# Patient Record
Sex: Female | Born: 1935 | Race: White | Hispanic: No
Health system: Southern US, Community
[De-identification: ages and names within clinical notes are randomized; demographics above are authoritative.]

---

## 2005-06-30 ENCOUNTER — Emergency Department: Payer: Self-pay | Admitting: Emergency Medicine

## 2005-06-30 ENCOUNTER — Other Ambulatory Visit: Payer: Self-pay

## 2005-12-10 ENCOUNTER — Other Ambulatory Visit: Payer: Self-pay

## 2005-12-10 ENCOUNTER — Inpatient Hospital Stay: Payer: Self-pay | Admitting: Internal Medicine

## 2006-12-21 ENCOUNTER — Other Ambulatory Visit: Payer: Self-pay

## 2006-12-21 ENCOUNTER — Inpatient Hospital Stay: Payer: Self-pay | Admitting: Internal Medicine

## 2012-08-02 ENCOUNTER — Ambulatory Visit: Payer: Self-pay | Admitting: Podiatry

## 2012-08-02 DIAGNOSIS — I10 Essential (primary) hypertension: Secondary | ICD-10-CM

## 2012-08-02 LAB — BASIC METABOLIC PANEL
Anion Gap: 7 (ref 7–16)
Calcium, Total: 9.2 mg/dL (ref 8.5–10.1)
Chloride: 101 mmol/L (ref 98–107)
Co2: 31 mmol/L (ref 21–32)
Creatinine: 0.77 mg/dL (ref 0.60–1.30)
EGFR (African American): >60
EGFR (Non-African Amer.): >60
Glucose: 106 mg/dL — ABNORMAL HIGH (ref 65–99)
Osmolality: 278 (ref 275–301)

## 2012-08-02 LAB — CBC WITH DIFFERENTIAL/PLATELET
Basophil %: 0.6 %
Eosinophil #: 0.4 10*3/uL (ref 0.0–0.7)
Eosinophil %: 4.2 %
Lymphocyte #: 2.2 10*3/uL (ref 1.0–3.6)
MCH: 30.2 pg (ref 26.0–34.0)
MCHC: 33.5 g/dL (ref 32.0–36.0)
Monocyte #: 0.9 x10 3/mm (ref 0.2–0.9)
Neutrophil #: 6.2 10*3/uL (ref 1.4–6.5)
Neutrophil %: 63.5 %
Platelet: 290 10*3/uL (ref 150–440)
RBC: 4.19 10*6/uL (ref 3.80–5.20)
RDW: 14 % (ref 11.5–14.5)
WBC: 9.7 10*3/uL (ref 3.6–11.0)

## 2012-08-08 ENCOUNTER — Ambulatory Visit: Payer: Self-pay | Admitting: Vascular Surgery

## 2012-08-08 LAB — CREATININE, SERUM
Creatinine: 0.94 mg/dL (ref 0.60–1.30)
EGFR (Non-African Amer.): 55 — ABNORMAL LOW

## 2012-08-12 ENCOUNTER — Ambulatory Visit: Payer: Self-pay | Admitting: Podiatry

## 2012-08-15 LAB — PATHOLOGY REPORT

## 2012-08-23 ENCOUNTER — Ambulatory Visit: Payer: Self-pay | Admitting: Internal Medicine

## 2012-09-01 ENCOUNTER — Ambulatory Visit: Payer: Self-pay | Admitting: Vascular Surgery

## 2012-09-01 LAB — CREATININE, SERUM
EGFR (African American): >60
EGFR (Non-African Amer.): >60

## 2012-09-02 LAB — CBC WITH DIFFERENTIAL/PLATELET
Basophil %: 0.4 %
Eosinophil %: 2.6 %
HCT: 32.9 % — ABNORMAL LOW (ref 35.0–47.0)
HGB: 11.5 g/dL — ABNORMAL LOW (ref 12.0–16.0)
Lymphocyte #: 2 10*3/uL (ref 1.0–3.6)
MCH: 31.1 pg (ref 26.0–34.0)
MCV: 89 fL (ref 80–100)
Monocyte #: 0.9 x10 3/mm (ref 0.2–0.9)
Monocyte %: 7.9 %
Neutrophil %: 70.3 %
RBC: 3.69 10*6/uL — ABNORMAL LOW (ref 3.80–5.20)

## 2012-09-02 LAB — BASIC METABOLIC PANEL
Anion Gap: 9 (ref 7–16)
Chloride: 104 mmol/L (ref 98–107)
Creatinine: 0.63 mg/dL (ref 0.60–1.30)
EGFR (Non-African Amer.): >60
Glucose: 90 mg/dL (ref 65–99)
Osmolality: 277 (ref 275–301)
Sodium: 139 mmol/L (ref 136–145)

## 2012-09-06 ENCOUNTER — Inpatient Hospital Stay: Payer: Self-pay | Admitting: Internal Medicine

## 2012-09-06 LAB — URINALYSIS, COMPLETE
Bacteria: NONE SEEN
Bilirubin,UR: NEGATIVE
Blood: NEGATIVE
Ketone: NEGATIVE
Ph: 6 (ref 4.5–8.0)
Specific Gravity: 1.013 (ref 1.003–1.030)
Squamous Epithelial: 1
WBC UR: 2 /HPF (ref 0–5)

## 2012-09-06 LAB — COMPREHENSIVE METABOLIC PANEL
BUN: 10 mg/dL (ref 7–18)
Chloride: 102 mmol/L (ref 98–107)
Creatinine: 0.75 mg/dL (ref 0.60–1.30)
EGFR (African American): >60
Glucose: 107 mg/dL — ABNORMAL HIGH (ref 65–99)
SGOT(AST): 23 U/L (ref 15–37)
SGPT (ALT): 16 U/L (ref 12–78)
Sodium: 140 mmol/L (ref 136–145)
Total Protein: 7.7 g/dL (ref 6.4–8.2)

## 2012-09-06 LAB — CBC
HCT: 31.8 % — ABNORMAL LOW (ref 35.0–47.0)
HGB: 10.7 g/dL — ABNORMAL LOW (ref 12.0–16.0)
MCH: 29.5 pg (ref 26.0–34.0)
MCHC: 33.6 g/dL (ref 32.0–36.0)
MCV: 88 fL (ref 80–100)
RBC: 3.62 10*6/uL — ABNORMAL LOW (ref 3.80–5.20)
RDW: 13.7 % (ref 11.5–14.5)

## 2012-09-07 LAB — URINE CULTURE

## 2012-09-07 LAB — CBC WITH DIFFERENTIAL/PLATELET
Basophil #: 0 10*3/uL (ref 0.0–0.1)
Basophil %: 0.6 %
Eosinophil #: 0.3 10*3/uL (ref 0.0–0.7)
HCT: 32 % — ABNORMAL LOW (ref 35.0–47.0)
HGB: 11 g/dL — ABNORMAL LOW (ref 12.0–16.0)
Lymphocyte #: 1.4 10*3/uL (ref 1.0–3.6)
Lymphocyte %: 17.9 %
MCHC: 34.3 g/dL (ref 32.0–36.0)
Monocyte #: 0.8 x10 3/mm (ref 0.2–0.9)
Monocyte %: 9.6 %
Neutrophil #: 5.5 10*3/uL (ref 1.4–6.5)
Neutrophil %: 68.1 %
Platelet: 289 10*3/uL (ref 150–440)
WBC: 8.1 10*3/uL (ref 3.6–11.0)

## 2012-09-07 LAB — COMPREHENSIVE METABOLIC PANEL
Albumin: 2.7 g/dL — ABNORMAL LOW (ref 3.4–5.0)
Alkaline Phosphatase: 84 U/L (ref 50–136)
Anion Gap: 7 (ref 7–16)
BUN: 9 mg/dL (ref 7–18)
Calcium, Total: 8.6 mg/dL (ref 8.5–10.1)
EGFR (Non-African Amer.): >60
Glucose: 99 mg/dL (ref 65–99)
Potassium: 3.8 mmol/L (ref 3.5–5.1)
SGOT(AST): 18 U/L (ref 15–37)
Sodium: 139 mmol/L (ref 136–145)
Total Protein: 7.3 g/dL (ref 6.4–8.2)

## 2012-09-11 LAB — PLATELET COUNT: Platelet: 283 10*3/uL (ref 150–440)

## 2012-09-13 LAB — CBC WITH DIFFERENTIAL/PLATELET
Basophil #: 0.1 10*3/uL (ref 0.0–0.1)
Basophil %: 0.5 %
Eosinophil %: 0.4 %
HCT: 30.1 % — ABNORMAL LOW (ref 35.0–47.0)
HGB: 9.9 g/dL — ABNORMAL LOW (ref 12.0–16.0)
Lymphocyte #: 1.3 10*3/uL (ref 1.0–3.6)
Lymphocyte %: 10.6 %
MCH: 29 pg (ref 26.0–34.0)
Monocyte #: 0.8 x10 3/mm (ref 0.2–0.9)
Monocyte %: 7 %
Neutrophil #: 9.7 10*3/uL — ABNORMAL HIGH (ref 1.4–6.5)
Neutrophil %: 81.5 %
Platelet: 320 10*3/uL (ref 150–440)
RBC: 3.42 10*6/uL — ABNORMAL LOW (ref 3.80–5.20)
RDW: 13.5 % (ref 11.5–14.5)
WBC: 11.9 10*3/uL — ABNORMAL HIGH (ref 3.6–11.0)

## 2012-09-13 LAB — CREATININE, SERUM: EGFR (Non-African Amer.): >60

## 2012-09-23 ENCOUNTER — Ambulatory Visit: Payer: Self-pay | Admitting: Internal Medicine

## 2012-10-10 ENCOUNTER — Ambulatory Visit: Payer: Self-pay | Admitting: Vascular Surgery

## 2012-10-13 ENCOUNTER — Ambulatory Visit: Payer: Self-pay | Admitting: Vascular Surgery

## 2012-10-23 ENCOUNTER — Ambulatory Visit: Payer: Self-pay | Admitting: Internal Medicine

## 2012-11-10 ENCOUNTER — Ambulatory Visit: Payer: Self-pay | Admitting: Vascular Surgery

## 2012-12-15 ENCOUNTER — Ambulatory Visit: Payer: Self-pay | Admitting: Vascular Surgery

## 2012-12-15 LAB — BASIC METABOLIC PANEL
Anion Gap: 6 — ABNORMAL LOW (ref 7–16)
BUN: 11 mg/dL (ref 7–18)
Calcium, Total: 8.9 mg/dL (ref 8.5–10.1)
Co2: 30 mmol/L (ref 21–32)
Creatinine: 0.68 mg/dL (ref 0.60–1.30)
EGFR (African American): >60
EGFR (Non-African Amer.): >60
Potassium: 4.4 mmol/L (ref 3.5–5.1)

## 2012-12-15 LAB — CBC
HCT: 34.8 % — ABNORMAL LOW (ref 35.0–47.0)
HGB: 11.3 g/dL — ABNORMAL LOW (ref 12.0–16.0)
MCHC: 32.3 g/dL (ref 32.0–36.0)
Platelet: 373 10*3/uL (ref 150–440)
RBC: 4.18 10*6/uL (ref 3.80–5.20)
RDW: 15.1 % — ABNORMAL HIGH (ref 11.5–14.5)
WBC: 9.3 10*3/uL (ref 3.6–11.0)

## 2012-12-22 ENCOUNTER — Ambulatory Visit: Payer: Self-pay | Admitting: Vascular Surgery

## 2012-12-24 ENCOUNTER — Ambulatory Visit: Payer: Self-pay | Admitting: Nurse Practitioner

## 2013-01-21 ENCOUNTER — Ambulatory Visit: Payer: Self-pay | Admitting: Nurse Practitioner

## 2013-04-16 IMAGING — XA IR VASCULAR PROCEDURE
15 of 18 series · 15 of 24 positions shown · IV contrast (IODINE)
Comparison: none

[Series 1: care aorta · 1 of 2 slices shown]
[im 1/2]
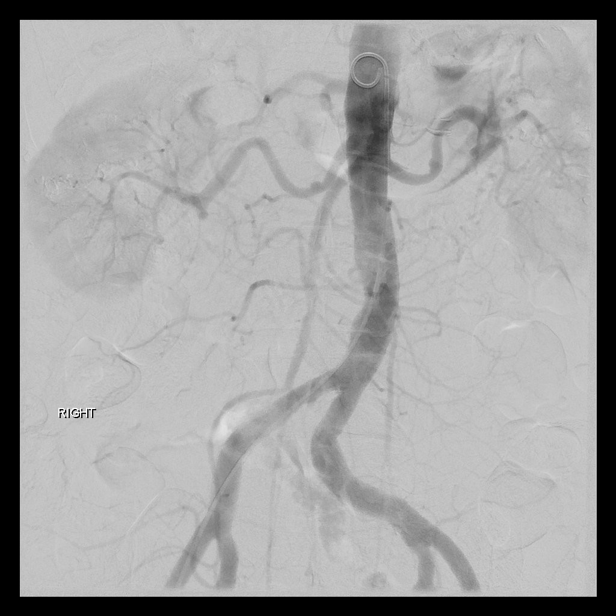

[Series 3: sfa · 1 of 1 slices shown (1 of 12)]
[im 1/1]
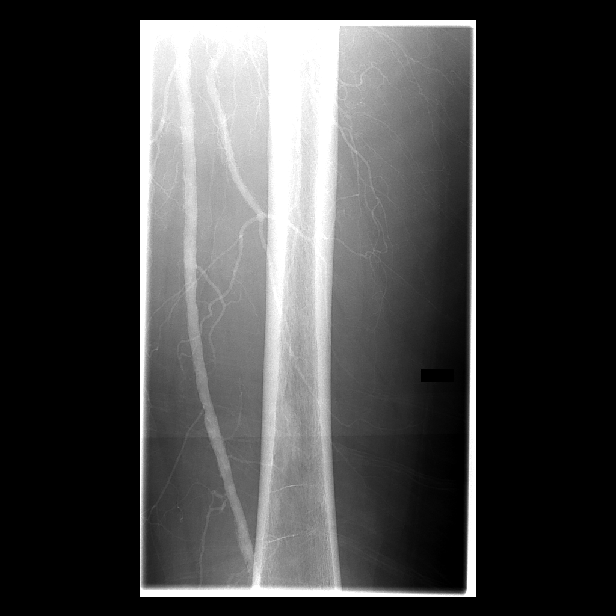

[Series 4: sfa · 1 of 2 slices shown (2 of 12)]
[im 2/2]
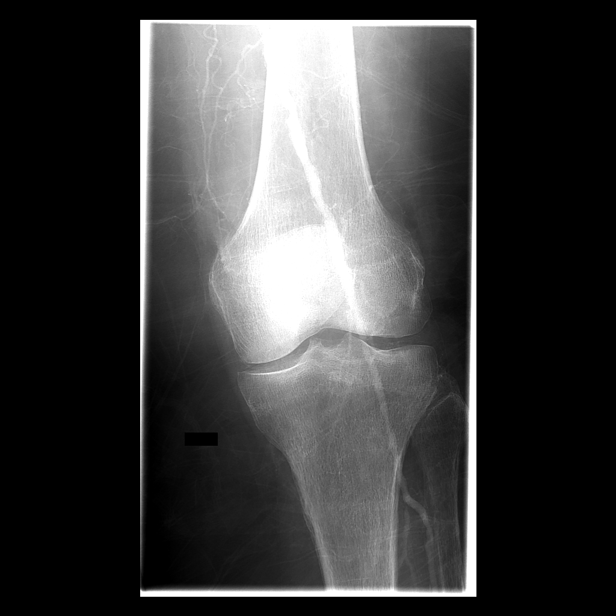

[Series 5: sfa · 1 of 1 slices shown (3 of 12)]
[im 1/1]
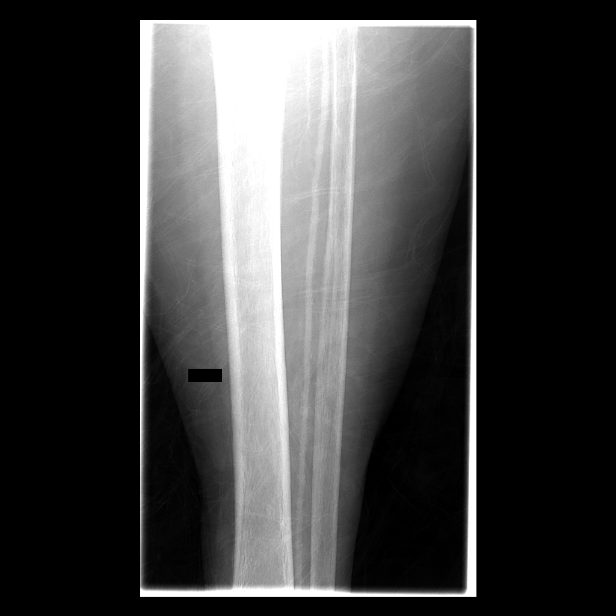

[Series 6: sfa · 1 of 2 slices shown (4 of 12)]
[im 2/2]
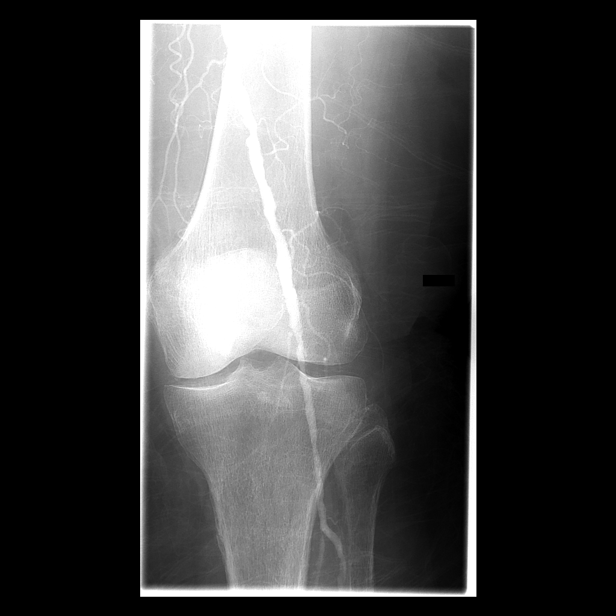

[Series 7: sfa · 1 of 2 slices shown (5 of 12)]
[im 1/2]
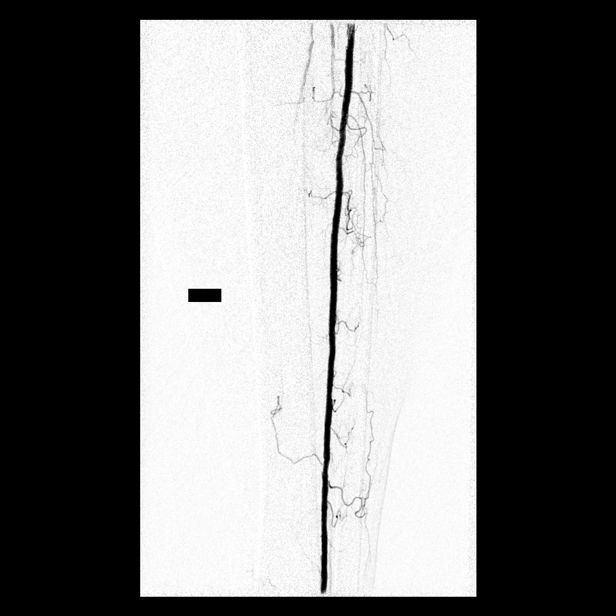

[Series 8: sfa · 1 of 1 slices shown (6 of 12)]
[im 1/1]
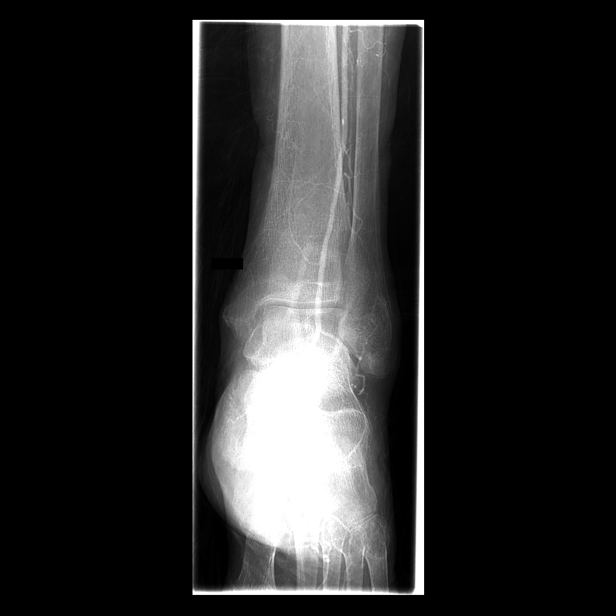

[Series 10: fl - angio · 1 of 1 slices shown (1 of 2)]
[im 1/1]
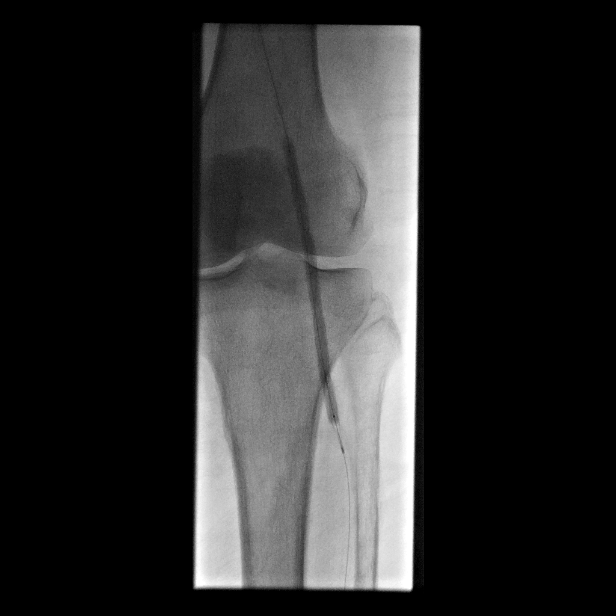

[Series 11: sfa · 1 of 2 slices shown (7 of 12)]
[im 1/2]
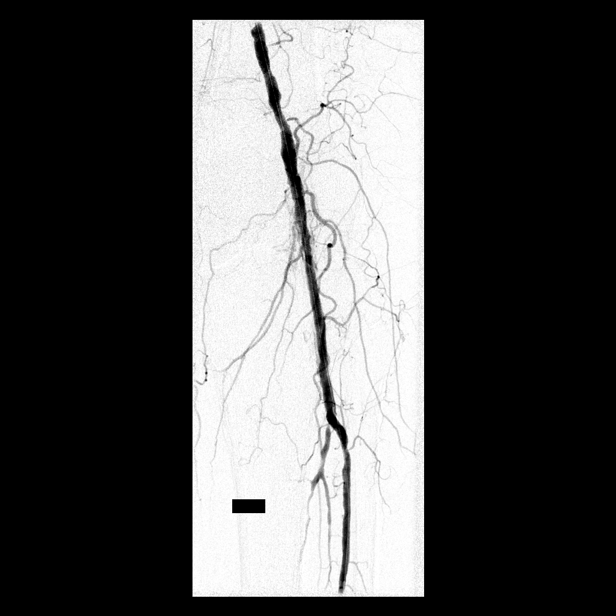

[Series 12: sfa · 1 of 1 slices shown (8 of 12)]
[im 1/1]
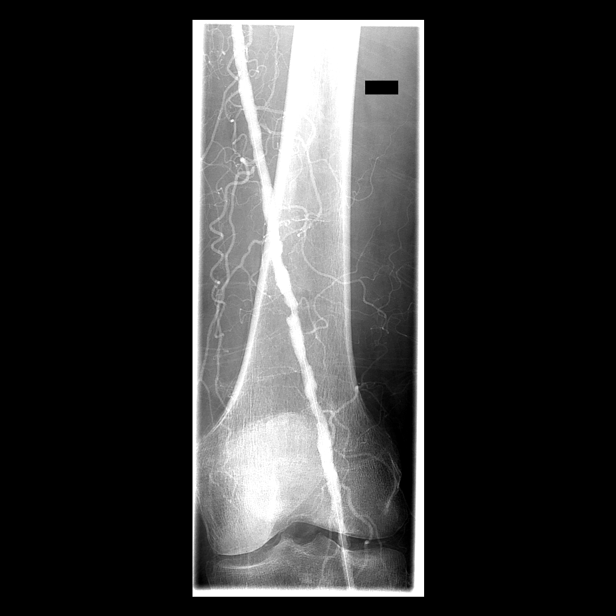

[Series 13: fl - angio · 1 of 1 slices shown (2 of 2)]
[im 1/1]
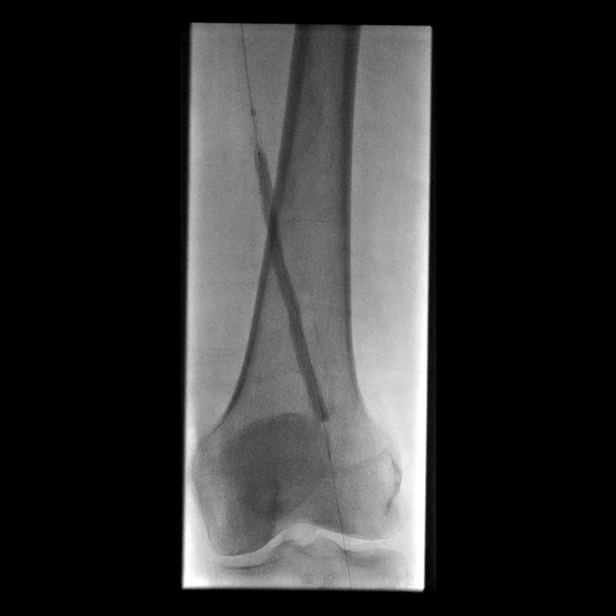

[Series 14: sfa · 1 of 2 slices shown (9 of 12)]
[im 2/2]
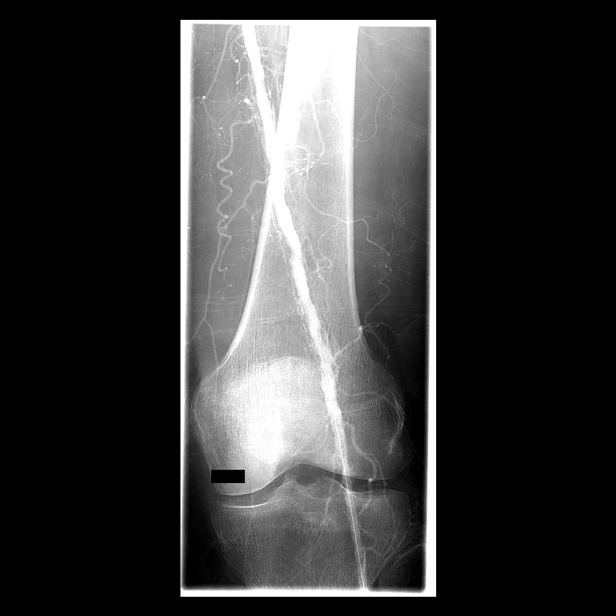

[Series 16: sfa · 1 of 1 slices shown (10 of 12)]
[im 1/1]
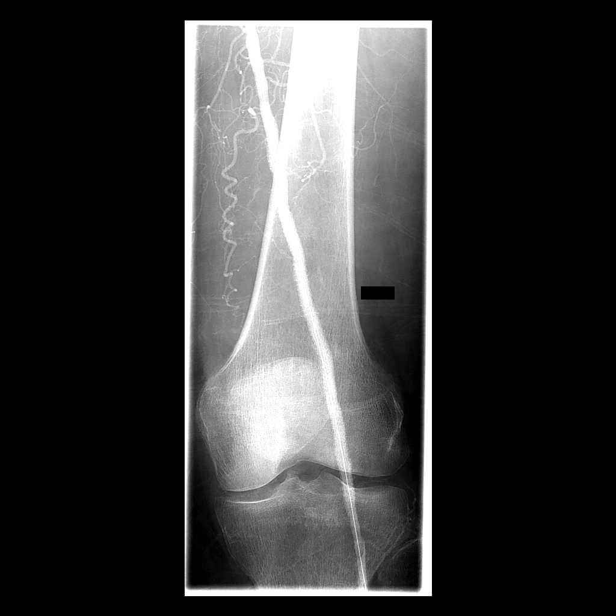

[Series 17: sfa · 1 of 2 slices shown (11 of 12)]
[im 1/2]
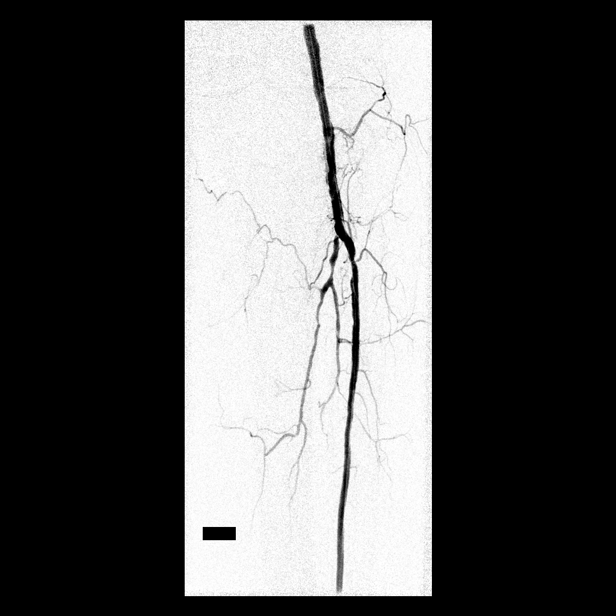

[Series 18: sfa · 1 of 1 slices shown (12 of 12)]
[im 1/1]
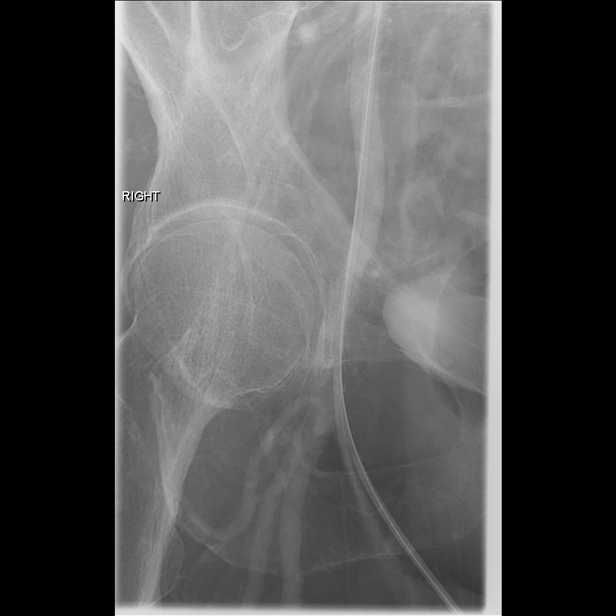

[15 of 24 positions shown; findings below may reference images not displayed]

IMAGES IMPORTED FROM THE [employer] WORKFLOW SYSTEM
NO DICTATION FOR STUDY

## 2013-05-15 IMAGING — CR DG CHEST 2V
1 series · 4 of 4 positions shown · non-contrast
Comparison: none

REASON FOR EXAM: pain
COMMENTS:

[Series 1: x chest ap · 0.14mm/px · 4 of 4 slices shown]
[im 1/4]
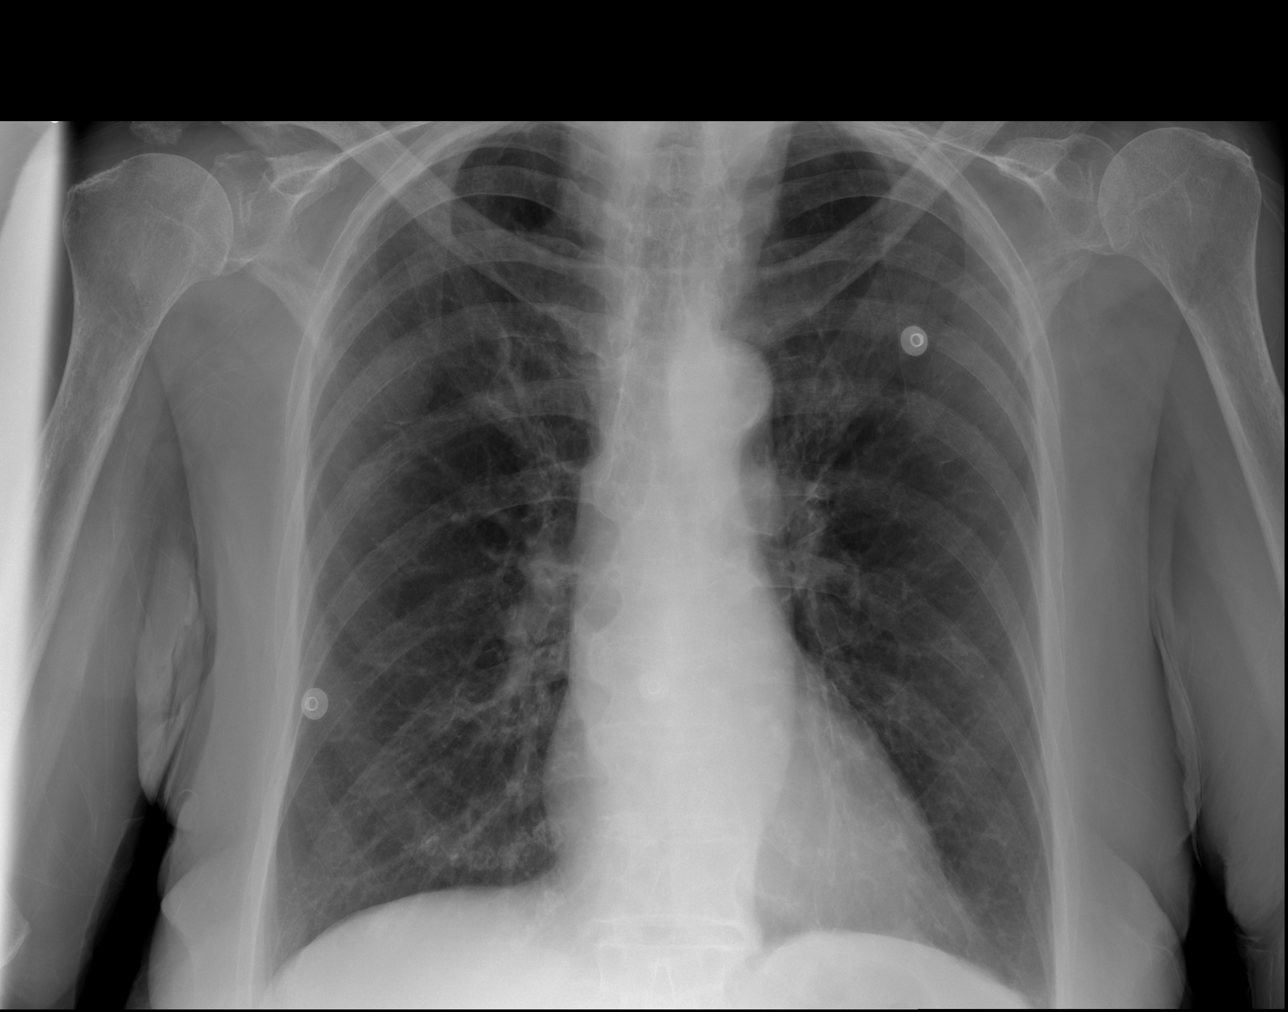
[im 2/4]
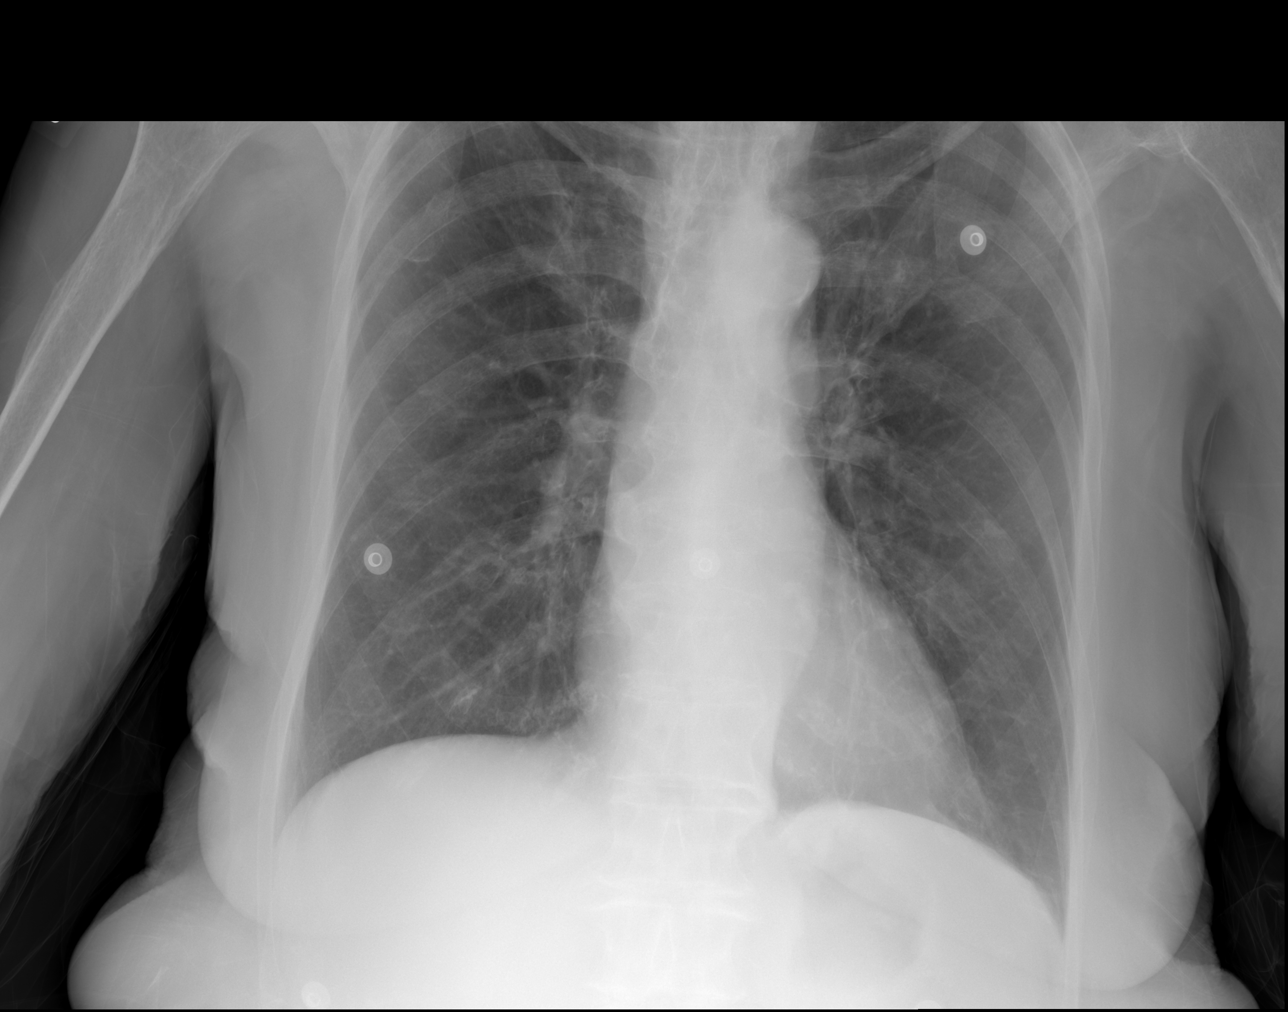
[im 3/4]
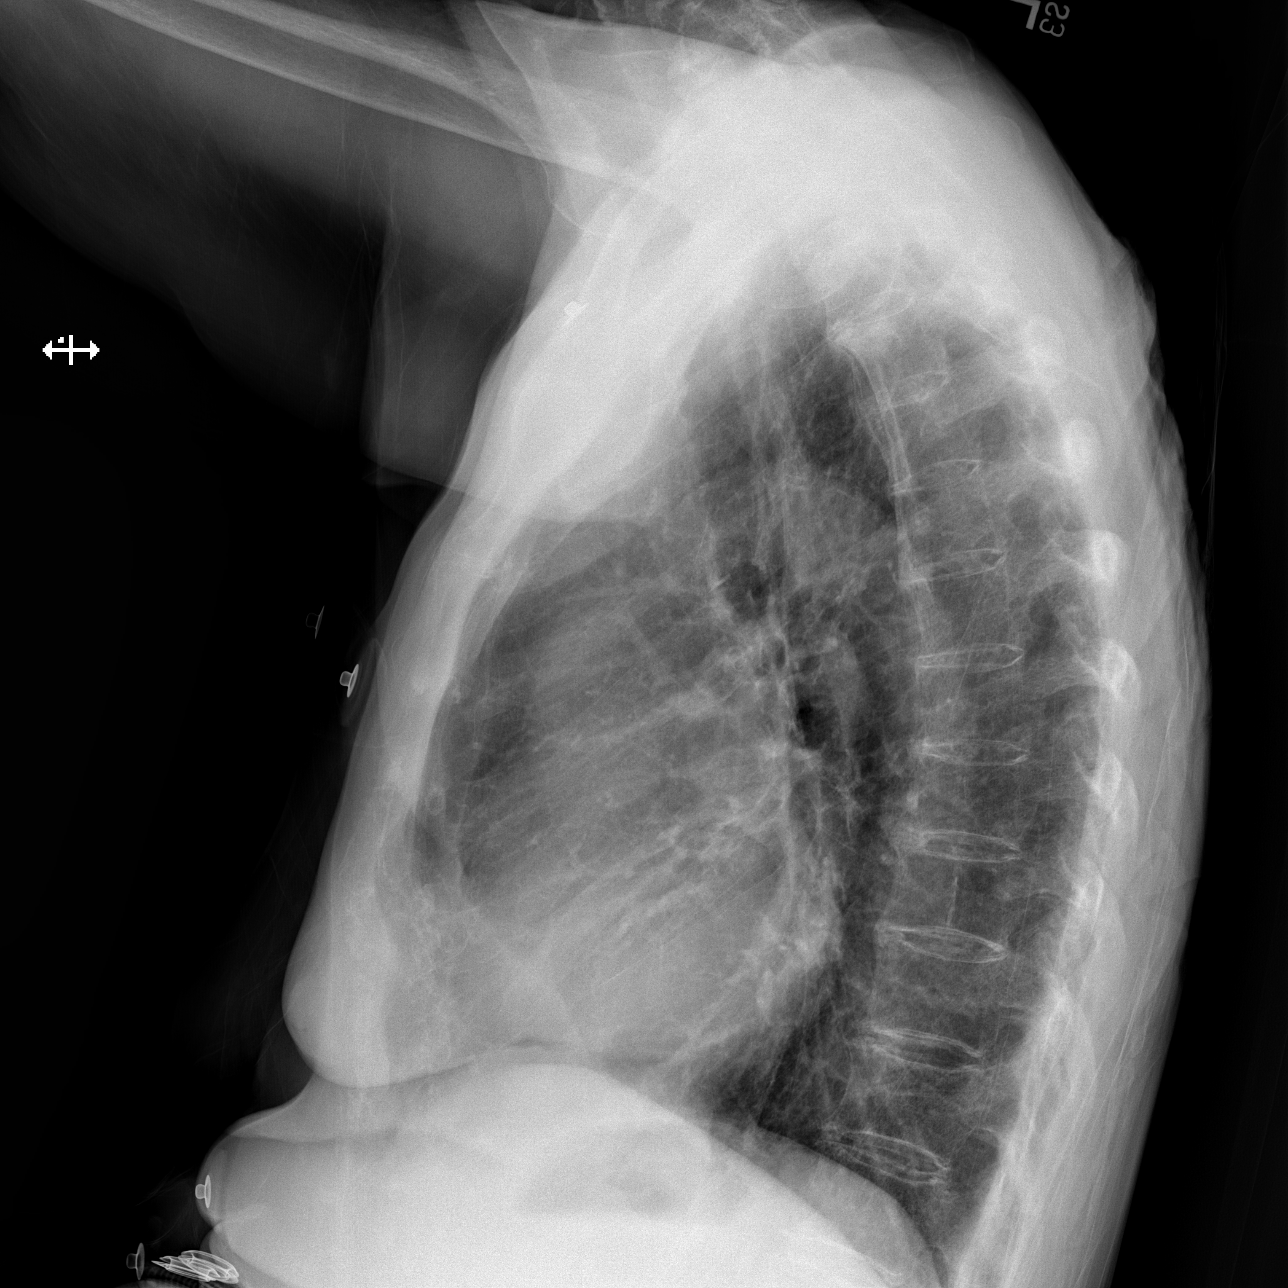
[im 4/4]
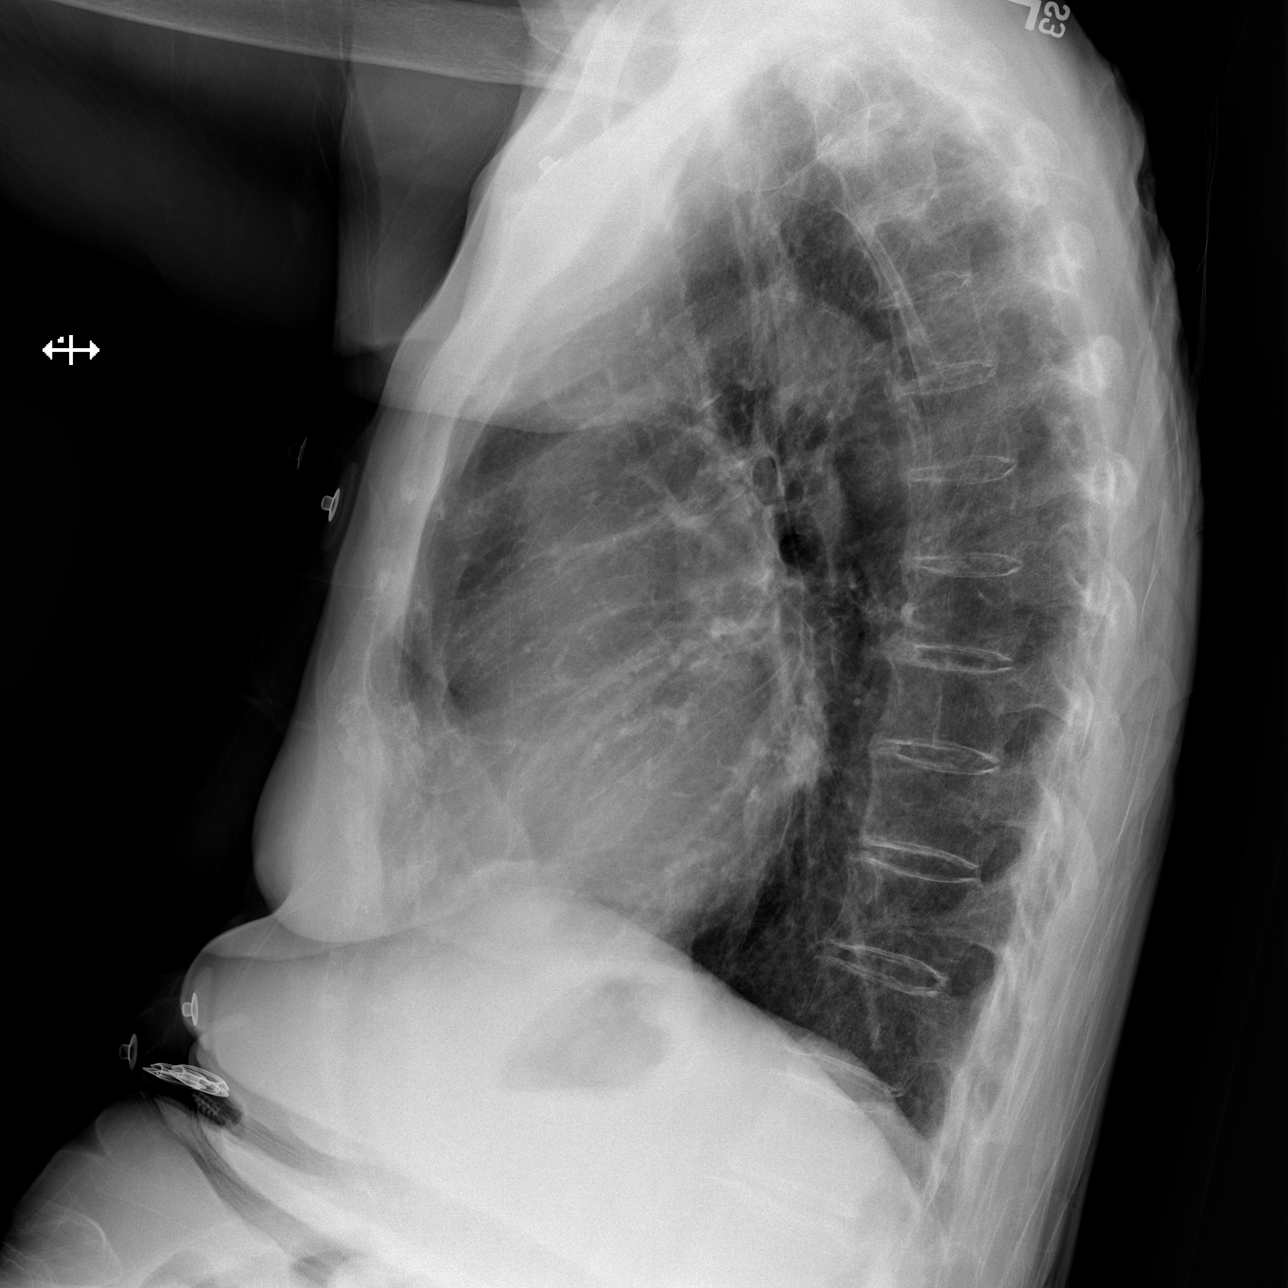

[4 of 4 positions shown; findings below may reference images not displayed]

PROCEDURE:     DXR - DXR CHEST PA (OR AP) AND LATERAL  - September 06, 2012 [DATE]

RESULT:     The lungs are clear. The heart and pulmonary vessels are normal.
The bony and mediastinal structures are unremarkable. There is no effusion.
There is no pneumothorax or evidence of congestive failure. The lungs are
hyperinflated consistent with COPD.
IMPRESSION: No acute cardiopulmonary disease.

[REDACTED]

## 2014-01-21 DEATH — deceased

## 2015-03-12 NOTE — Consult Note (Signed)
Patient readmitted with worsening pain in the left foot.   Her foot has gangenous changes just proximal to the mid foot with issues medially and laterally. She does have a pulse in the PT at the ankle, but at this point I do not think she will have enough foot to salvage, and her healing potential is poor with her overall condition.  Discussed with Dr. Ether GriffinsFowler, and think at this point a BKA may be the best option.   Will discuss with family, and plan for BKA in the upcoming days if they are agreeable  Electronic Signatures: Annice Needyew, Yalanda Soderman S (MD)  (Signed on 16-Oct-13 16:10)  Authored  Last Updated: 16-Oct-13 16:10 by Annice Needyew, Kaneshia Cater S (MD)

## 2015-03-12 NOTE — Op Note (Signed)
PATIENT NAME:  Sheri Chan, Sheri Chan MR#:  [id number] DATE OF BIRTH:  11-Jan-1924  DATE OF PROCEDURE:  08/08/2012  PREOPERATIVE DIAGNOSES:  1. Peripheral arterial disease with gangrene, left lower extremity.  2. Dementia.  3. Hypertension.  POSTOPERATIVE DIAGNOSES:  1. Peripheral arterial disease with gangrene, left lower extremity.  2. Dementia.  3. Hypertension.  PROCEDURES PERFORMED: 1. Ultrasound guidance for vascular access, right femoral artery.  2. Catheter placement into left popliteal artery from right femoral approach.  3. Aortogram and selective left lower extremity angiogram.  4. Percutaneous transluminal angioplasty with an AngioScore balloon to the distal SFA and popliteal artery, both above and below the knee.  5. Viabahn stent placement for residual stenosis and AV fistula after angioplasty in the above-knee popliteal artery.  6. StarClose closure device, right femoral artery.   SURGEON: Annice NeedyJason S. Tod Abrahamsen, M.D.   ANESTHESIA: Local with moderate conscious sedation.   ESTIMATED BLOOD LOSS: Minimal.   INDICATION FOR PROCEDURE: This is an 81 year old white female with a gangrenous left third toe. She has reduced perfusion but noninvasive study and angiogram is indicated for limb salvage attempt.   DESCRIPTION OF PROCEDURE: The patient was brought to the vascular interventional radiology suite. Groins were shaved and prepped and a sterile surgical field was created. Ultrasound was used to access a patent right femoral artery. It was accessed without difficulty with a Seldinger needle. J-wire and 5 French sheath were placed. Pigtail catheter was placed in the aorta at the L1 level and AP aortogram was performed. This showed patent renal arteries and no flow-limiting stenosis in the aortoiliac segments. I then hooked the aortic bifurcation and advanced to the left femoral head and selective left lower extremity angiogram was then performed. This showed multiple areas of moderate to  high-grade stenosis from Hunter's canal to the popliteal artery just above the takeoff of the anterior tibial artery and the anterior tibial artery had an approximately 30% to 40% stenosis in its proximal segment but then was large and patent into the foot. The patient was systemically heparinized. A 6-French high flex Ansel sheath was placed over a Terumo Advantage wire. I was able to cross the lesions and park a catheter in the anterior tibial artery and exchange for an 0.014 wire. The AngioScore balloon was inflated from the below-knee popliteal artery up to Hunter's canal in the distal superficial femoral artery. Several areas of wastes were taken which resolved. Completion angioplasty showed some residual waste of greater than 50% and an AV fistula within the above-knee popliteal artery. I elected to cover this with a 5 mm diameter x 15 cm in length Viabahn stent. The patient had a good angiographic completion result. At this point, I elected to terminate the procedure. The sheath was pulled back to the ipsilateral external iliac artery and oblique arteriogram was performed and StarClose closure device was deployed in the usual fashion with excellent hemostatic result. The patient tolerated the procedure well and was taken to the recovery room in stable condition.  ____________________________ Annice NeedyJason S. Gwendlyn Hanback, MD jsd:slb D: 08/08/2012 14:44:22 ET T: 08/08/2012 16:04:55 ET JOB#: [id number]  cc: Annice NeedyJason S. Edrik Rundle, MD, <Dictator> Annice NeedyJASON S Yamilett Anastos MD ELECTRONICALLY SIGNED 08/11/2012 8:19

## 2015-03-12 NOTE — Op Note (Signed)
PATIENT NAME:  Sheri Chan, Sheri Chan MR#:  [id number] DATE OF BIRTH:  December 27, 1923  DATE OF PROCEDURE:  11/10/2012  PREOPERATIVE DIAGNOSES:  1. Left below-knee amputation stump with necrotic tissue.  2. Dementia.  3. Peripheral vascular disease.   POSTOPERATIVE DIAGNOSES:  1. Left below-knee amputation stump with necrotic tissue.  2. Dementia.  3. Peripheral vascular disease.   PROCEDURE: Excisional debridement of soft tissue and muscle, left below-knee amputation stump, of approximately 35 to 40 sq cm, with vacuum-assisted closure dressing placement over the entirety of the open wound.   SURGEON: Annice NeedyJason S. Dew, MD   ANESTHESIA: General.   ESTIMATED BLOOD LOSS: 25 mL.   INDICATION FOR PROCEDURE: An 81 year old white female who has had a below-knee amputation. The majority of her stump had healed, but part of the wound had opened. There is a reasonable granulation base through most of the wound, but several centimeters in the midportion has necrotic tissue. She is brought in for debridement of this.   DESCRIPTION OF THE PROCEDURE: The patient is brought to the operative suite, and after an adequate level of anesthesia was obtained, the left lower extremity was sterilely prepped and draped, a sterile surgical field was created. Metzenbaum scissors were used to dissect out the clearly necrotic nonviable tissue off of the stump. The wound was then irrigated. Hemostasis achieved, and Adaptic and VAC sponge were placed, with Ioban used to gain an occlusive seal with the VAC dressing. An occlusive seal was obtained. The patient was then awakened from anesthesia and taken to the recovery room in stable condition.    ____________________________ Annice NeedyJason S. Dew, MD jsd:OSi D: 11/10/2012 16:20:02 ET T: 11/11/2012 07:58:05 ET JOB#: [id number]  cc: Annice NeedyJason S. Dew, MD, <Dictator> Annice NeedyJASON S DEW MD ELECTRONICALLY SIGNED 11/11/2012 18:37

## 2015-03-12 NOTE — H&P (Signed)
PATIENT NAME:  Sheri Chan, Sheri Chan MR#:  [id number] DATE OF BIRTH:  1924-11-09  DATE OF ADMISSION:  09/06/2012  PRIMARY CARE PHYSICIAN:  Dr. Burnett Sheng    HISTORY OF PRESENT ILLNESS:  The patient is an 81 year old Caucasian female with past medical history significant for history of peripheral artery disease with ulcerations on the left lower extremity status post multiple vascular procedures in the past to restore blood flow in this lower extremity, most recently on 09/01/2012, also history of  left toe amputation in 07/2012 by Dr. Ether Griffins, who presented to the hospital with complaints of intractable left lower extremity pain. Apparently the patient was admitted on 09/01/2012 for a vascular procedure. After that she was discharged to [facility] on 09/02/2012 with pain medication. She was noted to have increasing darkness of her left lower extremity and left foot, the distal part of the left foot as well as the second and fourth toes, which seemed to be getting darker/black in appearance. The pain was very poorly controlled and the patient was not able to do much with therapy because of significant pain and so she decided to come to the Emergency Room for further evaluation. Because of worsening gangrene of her left foot the hospitalist services were contacted for admission.   PAST MEDICAL AND SURGICAL  HISTORY:  1. History of critical peripheral artery disease with ulcerations in the left lower extremity status post abdominal aortogram with runoff on 09/01/2012. At that time she also had left lower extremity PTA and stent to the distal SFA as well as AK popliteal, also PTA to TP trunk and peroneal  mechanical thrombolytic thrombectomy of all above vessels under local anesthesia. The patient was noted to have total occlusion of her peripheral arteries, likely acute on chronic with thrombosis present. She underwent also left third toe gangrene amputation on 08/12/2012 by Dr. Ether Griffins.  Prior to that the patient  had peripheral artery disease with gangrene evaluation on 08/08/2012.  She had an abdominal aortogram with runoff left lower extremity, PTA and stent placed in the popliteal artery as well as distal superficial femoral artery with worsening of her condition. 2. History of dehydration and admission for the same 07/2012. 3. Depression.  4. Hypertension.  5. Migraine headaches.  6. Eczema.  7. Hemorrhoids.  8. History of admission in 11/2005 for atrial fibrillation with rapid ventricular response.  9. Diabetes mellitus borderline.  10. Partial hysterectomy in the 60s.  11. Cataract surgery bilateral.  12. Right ankle fracture.  13. Dementia.  14. Hypertension. 15. B12 deficiency.  ALLERGIES: None.   MEDICATIONS: According to medical records, the patient is on: 1. Aspirin 81 mg once daily.  2. Norco 5/325 mg 1 tablet every six hours as needed.  3. Plavix 75 mg p.o. daily.  4. Tramadol 1 mg once a day as needed for pain.   FAMILY HISTORY: Some family members with diabetes. The patient's sister had leukemia, another sister had some other kind of blood cancer, which was unusual, also breast carcinoma in other sister in her 67s. The patient's father had Alzheimer's disease. No alcohol, depression, anxiety, or bipolar disorder in the family. The patient's father also had cerebrovascular accident. Also congestive heart failure in family members. Patient's other sister died of failure to thrive.   SOCIAL HISTORY: The patient used to work in mills. No alcohol or drug abuse at this time.   REVIEW OF SYSTEMS: Positive for pains in the left foot for the past few days, seemed to be worsening, constipation,  and intermittent nausea and vomiting. Otherwise, no complaints of fevers, fatigue, weakness, weight loss or gain. EYES: In regards to eyes, denies any blurry vision, double vision, or glaucoma. ENT: Denies any tinnitus, allergies, epistaxis, sinus pain, dentures, or difficulty swallowing. RESPIRATORY:  Denies any cough, wheezes, asthma, or chronic obstructive pulmonary disease. CARDIOVASCULAR: Denies chest pains, orthopnea, edema, arrhythmias, palpitations, or syncope. GI: Denies any abdominal pains, rectal bleeding, or change in bowel habits. GU: Denies dysuria, hematuria, frequency, or incontinence. ENDOCRINE:  Denies polydipsia, nocturia, thyroid problems, heat or cold intolerance, or thirst.  HEME:  Denies anemia, easy bruising or bleeding. SKIN:  Denies acne, rash, lesions, or change in moles.  MUSCULOSKELETAL:  Denies arthritis, cramps, or swelling.  NEURO: Denies numbness, epilepsy, or tremor. PSYCH: Denies anxiety, insomnia, or depression.    PHYSICAL EXAMINATION:  VITAL SIGNS: On arrival to the hospital, temperature is 97.6, pulse 75, respiratory rate 18, blood pressure 120/48, saturation 98% on room air.   GENERAL: This is a well-nourished, pale Caucasian female, very uncomfortable lying on the stretcher. She is unhappy because of the pain.  HEENT:  Pupils are equal and reactive to light. Extraocular movements intact. No conjunctivitis. Normal hearing. No pharyngeal erythema.  Mucosa is dry.  NECK:  Neck did not reveal any masses. Supple, nontender. Thyroid not enlarged. No adenopathy. No JVD or carotid bruits bilaterally.  Full range of motion.   LUNGS: Clear to auscultation anteriorly. No rales, rhonchi, or wheezing. No labored inspirations, increased effort, dullness to percussion, or overt respiratory distress.   CARDIOVASCULAR: S1, S2 appreciated.  No murmurs, gallops, or rubs noted.  PMI not lateralized.  Chest is nontender to palpation.  Diminished pedal pulses of the left lower extremity, somewhat diminished on the right as well.  The patient does not have lower extremity edema or calf tenderness. However, the patient does have severe cyanosis, even blackened gangrenous toes of the left lower extremity and left foot, second and fourth toes were gangrenous. The patient also had areas  of gangrene on the great toe on the left. She has absent third toe. The patient does also have bluish discoloration of her distal foot.  MUSCULOSKELETAL:  Muscle strength- able to move all four extremities. No kyphosis or degenerative joint disease but the patient's gait was not tested.   SKIN: Skin revealed no significant rashes, but she did have a lesion on her fourth toe. Some erythema was noted there, induration and gangrenous areas of the second and fourth toes and great toe on the left.  Skin was cool to palpation.     LYMPH: No adenopathy in the cervical region.   NEUROLOGICAL: Cranial nerves grossly intact. Sensory is intact. No dysarthria or aphasia. The patient is alert, oriented to person and place. Cooperative. Memory is impaired. She is confused. No agitation or depression noted. Intermittently cooperative.   LABORATORY DATA: BMP showed glucose 107, otherwise unremarkable study. Albumin level 2.7, otherwise liver enzymes unremarkable. White blood cell count is normal at 9.3, hemoglobin 10.7, platelet count 303. In the recent past, 09/02/2012, the patient did have elevated absolute neutrophil count of 7.6 out of 10.8.   ASSESSMENT AND PLAN:  1. Left lower extremity, left foot, especially toe gangrene. We will admit the patient to the medical floor. Start the patient on antibiotic therapy. We will get Dr. Wyn Quaker and Dr. Ether Griffins to see the patient and make decisions about amputation.  2. Peripheral artery disease. We will continue aspirin, Plavix, and pain medications.  3. Diabetes mellitus. We  will continue sliding scale insulin and diabetic diet. 4. History of hypertension. We will be following the patient's blood pressure readings. The patient is not on any blood pressure medication at home. We will make decisions depending on her needs.  Her systolic blood pressure is quite good in the Emergency Room.  5. History of dementia. The patient may need some medications for discomfort and  pain-related confusion.   TIME SPENT: 50 minutes.   ____________________________ Katharina Caperima Tyvion Edmondson, MD rv:bjt D: 09/06/2012 15:15:02 ET T: 09/06/2012 16:32:24 ET JOB#: [id number]  cc: Katharina Caperima Tanush Drees, MD, <Dictator> Rhona LeavensJames F. Burnett ShengHedrick, MD Katharina CaperIMA Chrysa Rampy MD ELECTRONICALLY SIGNED 10/07/2012 12:31

## 2015-03-12 NOTE — Op Note (Signed)
PATIENT NAME:  Sheri Chan, Araly B MR#:  [id number] DATE OF BIRTH:  August 24, 1924  DATE OF PROCEDURE:  09/01/2012  PREOPERATIVE DIAGNOSES:  1. Peripheral arterial disease with ulceration and gangrenous changes left lower extremity.  2. Dementia.  3. Hypertension.   POSTOPERATIVE DIAGNOSES:  1. Peripheral arterial disease with ulceration and gangrenous changes left lower extremity.  2. Dementia.  3. Hypertension.   PROCEDURES:  1. Catheter placement into left peroneal artery from right femoral approach.  2. Aortogram and selective left lower extremity angiogram.  3. Percutaneous transluminal angioplasty of left TP trunk and peroneal arteries with 3 mm diameter angioplasty balloon.  4. Percutaneous transluminal angioplasty of distal SFA and above-knee popliteal artery with 5 mm diameter angioplasty balloon.  5. Self-expanding stent placement to distal superficial femoral artery and above-knee popliteal artery for greater than 50% residual stenosis and thrombosis after angioplasty.  6. Mechanical rheolytic thrombectomy to left distal SFA, popliteal artery, TP trunk and peroneal artery for residual thrombosis after above-mentioned procedures.   SURGEON: Annice NeedyJason S. Dew, MD  ANESTHESIA: Local with moderate conscious sedation.   ESTIMATED BLOOD LOSS: Approximately 25 mL.   INDICATION FOR PROCEDURE: This is an 81 year old female with previous history of intervention for gangrenous changes. She is status post toe amputation. She has developed darkened discoloration of several toes on her left foot as well as nonhealing ulcerations. A noninvasive study earlier in the week showed significant stenosis that reduced flow distally. She is brought in for an angiogram for further evaluation. Her daughter reports that her foot has gotten worse over the past 24 to 48 hours and was much more painful.   DESCRIPTION OF PROCEDURE: Patient was brought to the vascular interventional radiology suite. Groins shaved and  prepped, sterile surgical field was created. The right femoral head was localized with fluoroscopy and the right femoral artery was accessed without difficulty with a Seldinger needle. A J-wire and 5 French sheath were placed. Pigtail catheter was placed in the aorta at the L1-L2 level and AP aortogram was performed. This showed no flow-limiting stenosis in the aortoiliac segments. I then hooked the aortic bifurcation, advanced to the left femoral head and selective left lower extremity angiogram was then performed. This demonstrated occlusion of the distal superficial femoral artery just above the previously placed stent. The stent in the SFA and popliteal artery were occluded. She reconstituted peroneal artery distally. The patient was systemically heparinized. A 6 French Ansel sheath was placed over a Terumo advantage wire. I was able to cross these lesions with no difficulty at all and confirm intraluminal flow in the peroneal artery. There appeared to be some thrombus in this location and I performed a 3 mm diameter angioplasty balloon in the peroneal artery and tibioperoneal trunk, a 5 mm diameter angioplasty balloon in the popliteal and SFA. Following this, there was still stenosis and what appeared to be some degree of thrombosis particularly within the SFA and popliteal artery. I covered this area with a 6 mm diameter x 15 cm length stent and post dilated this to a 5 mm balloon but there was still residual thrombosis. At this point, I felt as if we should perform thrombectomy. The AngioJet 120 cm Omni catheter was used. Suction aspiration of approximately 130 mL of effluent throughout the distal SFA, popliteal artery, tibioperoneal trunk and peroneal arteries. Following this there was marked improvement in flow with in line flow in the peroneal artery to the ankle with collateralization of the foot. There was still some mild residual  thrombosis that was not flow limiting and I elected to keep her on an  Integrilin drip. The sheath was removed and a StarClose closure device deployed in the usual fashion with excellent hemostatic result. The patient tolerated procedure well and was taken to recovery room in stable condition.   ____________________________ Annice Needy, MD jsd:cms D: 09/02/2012 09:08:41 ET T: 09/02/2012 10:42:30 ET JOB#: [id number]  cc: Annice Needy, MD, <Dictator> Annice Needy MD ELECTRONICALLY SIGNED 09/05/2012 13:30

## 2015-03-12 NOTE — Discharge Summary (Signed)
PATIENT NAME:  Sheri Chan, [name] MR#:  [id number] DATE OF BIRTH:  1924/09/08  DATE OF ADMISSION:  09/01/2012 DATE OF DISCHARGE:  09/02/2012  FINAL DIAGNOSIS: Peripheral arterial disease with ulceration, in the left leg.   PROCEDURES PERFORMED DURING ADMISSION: Abdominal aortogram with runoff; left lower extremity PTA and stent to distal SFA and PTA of the tibioperoneal trunk and peroneal mechanical rheolytic thrombectomy of all blood vessels done by Dr. Festus BarrenJason Dew on 09/01/2012.   COMPLICATIONS: None.   CONSULTATIONS: None.   HOSPITAL COURSE: The patient was admitted. She underwent her procedure on 09/02/2012 without complication. Following the procedure, the patient was transferred from the postanesthesia care unit to the surgical floor where vital signs remained stable. She did receive preoperative and postoperative IV antibiotics and required no transfusions during this admission. The patient did receive an Integrilin drip postoperatively. The access site remained clean, dry, and intact. She was distally and neurovascularly intact as well. The patient is deemed stable for discharge to a skilled nursing facility for rehabilitation on 09/02/2012.   LABORATORY DATA: Hemoglobin 11.5 and hematocrit 32.9. Chemistries appeared stable.   DISCHARGE MEDICATIONS:  1. Plavix 75 mg 1 tablet p.o. daily.  2. Norco 5/325 mg 1 tablet p.o. every six hours p.r.n.  3. Aspirin 81 mg 1 tablet p.o. daily.   DISPOSITION: The patient is discharged to a skilled nursing facility in stable condition on 09/02/2012. She will start rehabilitation for gait training and strengthening. The patient will have      a follow-up appointment with [facility] in one month postoperatively. At that time, we will obtain ABIs. The patient understands to call with any questions or concerns in the interim.  ____________________________ Terrence DupontJason M. Donye Dauenhauer, PA-C jmk:slb D: 09/02/2012 08:23:37 ET T: 09/02/2012  13:29:28 ET JOB#: [id number]  cc: Terrence DupontJason M. Korban Shearer, PA-C, <Dictator> Terrence DupontJASON M Nilza Eaker PA ELECTRONICALLY SIGNED 09/07/2012 10:59

## 2015-03-12 NOTE — Op Note (Signed)
PATIENT NAME:  Sheri Chan, [name] MR#:  [id number] DATE OF BIRTH:  June 14, 1924  DATE OF PROCEDURE:  10/13/2012  PREOPERATIVE DIAGNOSES:  1. Dehiscence with infection left below-knee amputation stump site.  2. Peripheral arterial disease with gangrene, status post left below-knee amputation.  3. Dementia.   POSTOPERATIVE DIAGNOSES: 1. Dehiscence with infection left below-knee amputation stump site.  2. Peripheral arterial disease with gangrene, status post left below-knee amputation.  3. Dementia.   PROCEDURE: Irrigation and debridement of skin, soft tissue and muscle for approximately 120 sq cm to left below-knee amputation site stump with the VAC dressing placement.   SURGEON: Annice NeedyJason S. Dew, MD   ANESTHESIA: MAC.   ESTIMATED BLOOD LOSS: 25 mL.   INDICATION FOR PROCEDURE: The patient is an 81 year old white female who had a below-knee amputation several weeks ago. Her functional and nutritional status are quite poor. Her wound is dehisced and there is some foul smelling drainage from the wound. She is brought in for debridement of this and a VAC dressing to try to salvage her below-knee amputation.   DESCRIPTION OF PROCEDURE: The patient is brought to the operative suite and after an adequate level of intravenous sedation was obtained, the left lower extremity was sterilely prepped and draped and a sterile surgical field was created. The existing staples were removed. The wound was opened from the midportion of the wound medially. This had already opened. I debrided some nonviable skin on the medial edge and some nonviable muscle and soft tissue throughout the wound. One liter of pulse lavage saline was used to help irrigate the wound. The total size of the wound was somewhere in the range of 12 cm across and 10 cm wide with areas of depth that were in excess of 5 cm in some areas. A negative pressure dressing was then placed.   The patient was awakened from anesthesia and taken to the recovery  room in stable condition.    ____________________________ Annice NeedyJason S. Dew, MD jsd:drc D: 10/14/2012 08:30:37 ET T: 10/14/2012 10:28:26 ET JOB#: [id number]  cc: Annice NeedyJason S. Dew, MD, <Dictator> Annice NeedyJASON S DEW MD ELECTRONICALLY SIGNED 10/17/2012 11:46

## 2015-03-12 NOTE — Op Note (Signed)
PATIENT NAME:  Sheri Chan, Sheri Chan MR#:  [id number] DATE OF BIRTH:  11-04-1924  DATE OF PROCEDURE:  08/12/2012  PREOPERATIVE DIAGNOSIS: Left third toe gangrene.   POSTOPERATIVE DIAGNOSIS: Left third toe gangrene.    PROCEDURE: Amputation of left third toe at the metatarsophalangeal joint.   SURGEON:  Damaree Sargent A. Ether GriffinsFowler, DPM.  ANESTHESIA: Local with IV sedation.   COMPLICATIONS: None.   SPECIMEN: Gangrenous left third toe.   ESTIMATED BLOOD LOSS: Minimal.   OPERATIVE INDICATIONS: This is an 81 year old female who has developed a gangrenous left third toe. She underwent angioplasty and revascularization of her left lower leg. At this point she presents today for surgical procedure. All risks, benefits, alternatives, and complications associated with the surgery were discussed with the patient in full and consent has been given.   OPERATIVE PROCEDURE: The patient was brought into the Operating Room and placed on the operating table in the supine position. General intubation was administered by the anesthesia team. Local sedation was administered as stated above. A local block placed around the third toe was placed with lidocaine and Marcaine. After sterile prep and drape, a racket-type of incision was made around the MTPJ. Full thickness dissection was taken down to the MTPJ itself. The toe was disarticulated at the joint. No purulent drainage or signs of infection were noted. Minimal bleeding was noted with the procedure. No tourniquet was used. This wound was then flushed with copious amounts of irrigation. Layered closure was performed with a 4-0 Vicryl for the deeper layers and a 4-0 nylon for skin. Residual 0.5% Marcaine block was placed around the surgical site. A well-compressive large sterile bulky dressing was placed around the surgical site.     She was taken from the Operating Room to the PACU with all vital signs stable and neurovascular status intact. I will see her in the outpatient  clinic in 5 to 7 days.  ____________________________ Argentina DonovanJustin A. Ether GriffinsFowler, DPM jaf:ap D: 08/12/2012 08:07:29 ET T: 08/12/2012 10:24:37 ET JOB#: [id number]  cc: Jill AlexandersJustin A. Ether GriffinsFowler, DPM, <Dictator> Kyleena Scheirer DPM ELECTRONICALLY SIGNED 08/15/2012 8:32

## 2015-03-12 NOTE — Op Note (Signed)
PATIENT NAME:  Biagio QuintURCELL, Chiquetta B MR#:  [id number] DATE OF BIRTH:  1924/09/10  DATE OF PROCEDURE:  09/12/2012  PREOPERATIVE DIAGNOSES:  1. Peripheral arterial disease with gangrene, left foot.  2. Dementia.  3. Diabetes.   4. Hypertension.   POSTOPERATIVE DIAGNOSES: 1. Peripheral arterial disease with gangrene, left foot.  2. Dementia.  3. Diabetes.   4. Hypertension.   PROCEDURE: Left below-knee amputation.   SURGEON: Annice NeedyJason S. Rodneisha Bonnet, MD   ANESTHESIA: General.   ESTIMATED BLOOD LOSS: Approximately 150 mL.  INDICATION FOR PROCEDURE: This is an 81 year old white female well known to me for her peripheral vascular disease. She was admitted last week. She has undergone toe amputation and debridement by Dr. Ether GriffinsFowler. She has undergone two previous revascularizations by me. At this point she does have a pulse at the ankle and her foot is basically warm to the mid foot but she has gangrenous changes all along the lateral aspect of the foot, part of the medial aspect of the foot, and at least halfway up to the mid foot. At this point it was not felt she would have a salvageable foot for weightbearing and after discussions with the family and Dr. Ether GriffinsFowler we decided that a below-knee amputation was likely in her best interest. Risks and benefits were discussed with the patient and she was agreeable with proceeding.   DESCRIPTION OF PROCEDURE: The patient was brought to the operative suite and after an adequate level of general anesthesia was obtained the left upper extremity was sterilely prepped and draped and a sterile surgical field was created. The incision was made approximately three fingerbreadths below the tibial tuberosity on the left in the anterior portion and a long posterior flap was created to use the gastrocnemius muscle. The tibia was dissected out. Periosteal elevator was used and we dissected several centimeters above the location of the incision. The muscle bellies were then dissected out  between the tibia and fibula. The vascular bundle was identified, ligated and divided between silk ties with a silk stick tie used proximally. The fibula was then dissected out. This was transected with bone cutters well cephalad to the incision. The tibia was transected with the oscillating saw. The amputation knife was used to complete our posterior flap. Hemostats were used to help control bleeders which were ligated with 2-0 silk ties or stick ties and electrocautery was used for the small vessels. The wound was then irrigated and closed with a series of interrupted 0 Vicryl figure-of-eight sutures. The subcutaneous tissue was closed with two running 2-0 Vicryls. Skin was coapted with staples. Sterile dressing was then placed. The patient was then awakened from anesthesia and taken to the recovery room in stable condition.   ____________________________ Annice NeedyJason S. Abrey Bradway, MD jsd:drc D: 09/12/2012 15:41:20 ET T: 09/12/2012 16:18:08 ET JOB#: [id number]  cc: Annice NeedyJason S. Dodger Sinning, MD, <Dictator> Argentina DonovanJustin A. Ether GriffinsFowler, DPM Rhona LeavensJames F. Burnett ShengHedrick, MD Annice NeedyJASON S Aikeem Lilley MD ELECTRONICALLY SIGNED 09/14/2012 7:35

## 2015-03-12 NOTE — Discharge Summary (Signed)
PATIENT NAME:  Sheri Chan, Sheri Chan MR#:  [id number] DATE OF BIRTH:  06-Jun-1924  DATE OF ADMISSION:  09/06/2012 DATE OF DISCHARGE:  09/16/2012  DISCHARGE DIAGNOSES:  1. Left foot and toe gangrene, status post left-sided below knee amputation, doing well. 2. Confusion, likely due to acute delirium due to pain. Not anymore confused. Much more awake and alert today.   SECONDARY DIAGNOSES:  1. History of peripheral arterial disease and ulcerations.  2. Depression.  3. Hypertension. 4. History of migraine headaches. 5. Eczema.  6. Hemorrhoids.  7. Borderline diabetes.  8. Hypertension.  9. Dementia.   CONSULTANTS:  1. Gwyneth RevelsJustin Fowler, MD - Podiatry. 2. Festus BarrenJason Dew, MD - Vascular Surgery. 3. Physical Therapy.   PROCEDURES/RADIOLOGY: Left below knee amputation by Dr. Wyn Quakerew on 09/12/2012.   Left foot x-ray on 09/06/2012 showed chronic changes. No definite acute bony abnormality.   Chest x-ray on 09/06/2012 showed no acute cardiopulmonary disease.   MAJOR LABORATORY PANEL: Urinalysis on admission was negative.   Blood cultures x2 were negative.  Urine culture was contaminated.   Pathology of the amputee showed gangrenous changes. Atherosclerotic vessels with calcification.   HISTORY AND SHORT HOSPITAL COURSE: The patient is an 81 year old female with the above-mentioned medical problems who was admitted for intractable left foot pain and possible gangrene. She was started on IV antibiotic for possible infection around the gangrenous site. Podiatry and vascular surgery consultation was obtained. Please see Dr. Belva Chimesima Vaickute's dictated history and physical for further details. After evaluation by podiatry and vascular surgery, the decision was made to have left below-knee amputation considering her severe disease. This was performed on 09/12/2012 by Dr. Wyn Quakerew and she had been evaluated by physical therapy and has been working with them. She did have some confusion which was thought to be due to acute  delirium with underlying dementia. She was slowly improving and has been stable enough to be discharged to rehab today. [facility].  DISCHARGE PHYSICAL EXAMINATION:  VITALS: On the date of discharge her temperature was 97.5, heart rate 77 per minute, respirations 18 per minute, blood pressure 131/77 mmHg, and she was saturating 95% on room air.   CARDIOVASCULAR: S1 and S2 normal. No murmurs, rubs, or gallops.  PULMONARY: Lungs are clear to auscultation bilaterally. No wheezing, rales, rhonchi, or crepitation.   ABDOMEN: Soft, benign.   NEUROLOGIC: Nonfocal examination.  EXTREMITIES: Her left amputation site bandage looks clean with no drainage and no signs of infection. All other physical examination remained at baseline.   DISCHARGE MEDICATIONS:  1. Norco 5/325 mg one tablet p.o. every six hours as needed.  2. Aspirin 81 mg p.o. daily.  3. Plavix 75 mg p.o. daily.  4. Tramadol 100 mg p.o. daily.  5. Metoprolol 25 mg p.o. daily.  6. Colace one tablet p.o. twice a day as needed.  7. Senokot one tablet p.o. daily as needed. 8. Ensure 240 mL p.o. three times daily with each meal.   DISCHARGE DIET: Low sodium.   DISCHARGE ACTIVITY: As tolerated.   DISCHARGE INSTRUCTIONS AND FOLLOW-UP: The patient was instructed to follow-up with her primary care physician, Dr. Burnett ShengHedrick, in 1 to 2 weeks. She will need follow-up with [facility], Dr. Wyn Quakerew, in 3 weeks for wound check and staple removal. She will get physical therapy evaluation and management while at the facility [facility]  TOTAL TIME SPENT: 55 minutes.   ____________________________ Ellamae SiaVipul S. Sherryll BurgerShah, MD vss:slb D:  09/16/2012 09:14:22 ET T: 09/16/2012 09:32:05 ET JOB#: [id number]  cc: Kaire Stary S. Sherryll Burger, MD, <Dictator> Rhona Leavens. Burnett Sheng, MD Annice Needy, MD Argentina Donovan Ether Griffins, DPM Ellamae Sia [facility] MD ELECTRONICALLY SIGNED 09/16/2012 11:27

## 2015-03-12 NOTE — Consult Note (Signed)
Consult re left foot ischemia. with worsening ischemia to left foot.thrombectomy recently.worsening changes to left foot. this time there is gangrene to 3rd toe amp site, 2nd, 4th and great toes with gangrenous changes to lateral and dorsal foot to base of 5th metatarsal and dorsal midfoot. do not believe this will be salvageable as likelyhood of healing a choparts amputation with the dorsal and lateral gangrenous changes is minimal.would recommend BKA.  I spoke to pts daughter about this last week.  Made an attempt to contact daughter but was unable to reach her.d/w with Dr. Wyn Quakerew.  Electronic Signatures: Gwyneth RevelsFowler, Adin Laker (MD)  (Signed on 15-Oct-13 19:11)  Authored  Last Updated: 15-Oct-13 19:11 by Gwyneth RevelsFowler, Halla Chopp (MD)

## 2015-03-12 NOTE — Discharge Summary (Signed)
PATIENT NAME:  Sheri Chan, Sheri Chan MR#:  [id number] DATE OF BIRTH:  [birth year]  DATE OF ADMISSION:  09/06/2012 DATE OF DISCHARGE:  09/16/2012  ADDENDUM   Palliative care, Dr. Harvie JuniorPhifer and team, evaluated and have talked to the patient and new medication has been added after discussion with the family.   ADDITIONAL DISCHARGE MEDICATION: Mirtazapine 7.5 mg p.o. at bedtime.   Also, the patient's CODE STATUS has been changed from FULL CODE to DO NOT RESUSCITATE. Dr. Harvie JuniorPhifer and team have already talked with the family and they are in agreement. At this time we are waiting for the patient to have a bowel movement before discharge. Fleet's enema has been ordered for this.    Patient did have bowel movement per nursing so will be discharged now.   ____________________________ Sheri Kraynak S. Sherryll BurgerShah, MD vss:ap D: 09/16/2012 11:55:46 ET T: 09/16/2012 12:15:51 ET JOB#: [id number]  cc: Sheri Galli S. Sherryll BurgerShah, MD, <Dictator> Sheri SiaVIPUL S [facility] MD ELECTRONICALLY SIGNED 09/16/2012 13:13

## 2015-03-15 NOTE — Op Note (Signed)
PATIENT NAME:  Biagio QuintURCELL, [place] B MR#:  [id number] DATE OF BIRTH:  1924-06-19  DATE OF PROCEDURE:  12/22/2012  PREOPERATIVE DIAGNOSES:  1.  Nonhealing wound left below-knee amputation stump.  2.  Dementia.  3.  Peripheral vascular disease, status post left below-knee amputation.  POSTOPERATIVE DIAGNOSES: 1.  Nonhealing wound left below-knee amputation stump.  2.  Dementia.  3.  Peripheral vascular disease, status post left below-knee amputation.  PROCEDURES: AmnioMatrix injection and 6 x 3 Amniox graft placement to open wound left below-knee amputation stump.   SURGEON: Festus BarrenJason Dew, M.D.   ANESTHESIA: General.   ESTIMATED BLOOD LOSS: Minimal.   INDICATION FOR PROCEDURE: This is an 81 year old white female, who has undergone a left below-knee amputation. She has residual nonhealing wound and after multiple debridements this is now clean and healthy-appearing with good granulation tissue. She is brought in for a synthetic skin graft to attempt to epithelialize the wound.   DESCRIPTION OF PROCEDURE: The patient's left lower extremity was sterilely prepped and draped and a sterile surgical field was created. The 3 mL of AmioMatrix was then injected into the wound with multiple injections. A 6 x 3 non-meshed Amniox graft was then made small perforations and stretched. It was then secured to cover approximately 90% of the wound over  somewhere in the range of 20 to 25 sq cm. This was secured down to the skin with staples. Adaptic was then placed over the graft and secured with staples and then ABDs, fluffs, Kerlix, and wrap Kerlix were placed. The patient was then awakened from anesthesia and taken to the recovery room in stable condition having tolerated the procedure well.   ____________________________ Annice NeedyJason S. Dew, MD jsd:aw D: 12/22/2012 15:00:40 ET T: 12/22/2012 16:05:55 ET JOB#: [id number]  cc: Annice NeedyJason S. Dew, MD, <Dictator> Annice NeedyJASON S DEW MD ELECTRONICALLY SIGNED 12/24/2012 15:40
